# Patient Record
Sex: Female | Born: 1985 | Race: White | Hispanic: No | Marital: Married | Smoking: Former smoker
Health system: Southern US, Community
[De-identification: ages and names within clinical notes are randomized; demographics above are authoritative.]

## PROBLEM LIST (undated history)

## (undated) DIAGNOSIS — Z889 Allergy status to unspecified drugs, medicaments and biological substances status: Secondary | ICD-10-CM

## (undated) DIAGNOSIS — Q999 Chromosomal abnormality, unspecified: Secondary | ICD-10-CM

## (undated) DIAGNOSIS — M1990 Unspecified osteoarthritis, unspecified site: Secondary | ICD-10-CM

## (undated) HISTORY — PX: MOUTH SURGERY: SHX715

## (undated) HISTORY — DX: Chromosomal abnormality, unspecified: Q99.9

## (undated) HISTORY — DX: Allergy status to unspecified drugs, medicaments and biological substances: Z88.9

## (undated) HISTORY — DX: Unspecified osteoarthritis, unspecified site: M19.90

---

## 2016-07-17 ENCOUNTER — Ambulatory Visit (INDEPENDENT_AMBULATORY_CARE_PROVIDER_SITE_OTHER): Payer: BLUE CROSS/BLUE SHIELD | Admitting: Women's Health

## 2016-07-17 ENCOUNTER — Encounter: Payer: Self-pay | Admitting: Women's Health

## 2016-07-17 ENCOUNTER — Encounter: Payer: Self-pay | Admitting: *Deleted

## 2016-07-17 ENCOUNTER — Ambulatory Visit (INDEPENDENT_AMBULATORY_CARE_PROVIDER_SITE_OTHER): Payer: BLUE CROSS/BLUE SHIELD | Admitting: *Deleted

## 2016-07-17 ENCOUNTER — Other Ambulatory Visit: Payer: Self-pay | Admitting: Women's Health

## 2016-07-17 VITALS — BP 110/58 | HR 80 | Ht <= 58 in | Wt 102.0 lb

## 2016-07-17 DIAGNOSIS — N6310 Unspecified lump in the right breast, unspecified quadrant: Secondary | ICD-10-CM

## 2016-07-17 DIAGNOSIS — Q999 Chromosomal abnormality, unspecified: Secondary | ICD-10-CM | POA: Diagnosis not present

## 2016-07-17 DIAGNOSIS — N63 Unspecified lump in unspecified breast: Secondary | ICD-10-CM | POA: Insufficient documentation

## 2016-07-17 DIAGNOSIS — Z3042 Encounter for surveillance of injectable contraceptive: Secondary | ICD-10-CM | POA: Diagnosis not present

## 2016-07-17 DIAGNOSIS — Z3202 Encounter for pregnancy test, result negative: Secondary | ICD-10-CM | POA: Diagnosis not present

## 2016-07-17 DIAGNOSIS — M0890 Juvenile arthritis, unspecified, unspecified site: Secondary | ICD-10-CM | POA: Diagnosis not present

## 2016-07-17 DIAGNOSIS — Z720 Tobacco use: Secondary | ICD-10-CM | POA: Diagnosis not present

## 2016-07-17 DIAGNOSIS — Z01411 Encounter for gynecological examination (general) (routine) with abnormal findings: Secondary | ICD-10-CM | POA: Diagnosis not present

## 2016-07-17 DIAGNOSIS — N6320 Unspecified lump in the left breast, unspecified quadrant: Secondary | ICD-10-CM

## 2016-07-17 DIAGNOSIS — F17200 Nicotine dependence, unspecified, uncomplicated: Secondary | ICD-10-CM | POA: Insufficient documentation

## 2016-07-17 DIAGNOSIS — Z01419 Encounter for gynecological examination (general) (routine) without abnormal findings: Secondary | ICD-10-CM

## 2016-07-17 LAB — POCT URINE PREGNANCY: PREG TEST UR: NEGATIVE

## 2016-07-17 MED ORDER — MEDROXYPROGESTERONE ACETATE 150 MG/ML IM SUSP: 150.0000 mg | INTRAMUSCULAR | 3 refills | Status: DC

## 2016-07-17 MED ORDER — MEDROXYPROGESTERONE ACETATE 150 MG/ML IM SUSP
150.0000 mg | Freq: Once | INTRAMUSCULAR | Status: AC
  Administered 2016-07-17: 150 mg via INTRAMUSCULAR

## 2016-07-17 NOTE — Progress Notes (Signed)
Pt here for Depo. Pt tolerated shot well. Return in 12 weeks for next shot. JSY 

## 2016-07-17 NOTE — Patient Instructions (Signed)
Your breast ultrasound is scheduled for 8/1 @ 4:10pm at [facility]- be there at 3:50pm to register. No lotion/deoderant/powder/perfume that day

## 2016-07-17 NOTE — Progress Notes (Signed)
Subjective:   Angelica Allen is a 30 y.o. G31P1001 Caucasian female here for a routine well-woman exam.  No LMP recorded (lmp unknown).    Current complaints: bilateral breasts sore and recently lactating from bilateral breasts after 79yr of not breastfeeding. Mom recently had abnormal mammogram and is going back for further w/u-so she is concerned.  Recently moved here, has been getting Depo since April 2015 in [place]- last shot 4/29, so is due for one now.  PCP: none       Does desire labs, doesn't desire STI screening  Social History: Sexual: heterosexual Marital Status: married Living situation: with family Tobacco/alcohol: smokes ~1/2ppd, not interested in quitting  The following portions of the patient's history were reviewed and updated as appropriate: allergies, current medications, past family history, past medical history, past social history, past surgical history and problem list.  Past Medical History Past Medical History:  Diagnosis Date  . Arthritis   . Chromosomal abnormality   . Multiple allergies     Past Surgical History Past Surgical History:  Procedure Laterality Date  . CESAREAN SECTION      Gynecologic History G1P1001 No LMP recorded (lmp unknown). Contraception: Depo-Provera injections Last Pap: 03/2015. Results were: normal in [place] Last mammogram: never. Results were: n/a Last TCS: never  Obstetric History OB History  Gravida Para Term Preterm AB Living  1            SAB TAB Ectopic Multiple Live Births          1    # Outcome Date GA Lbr Len/2nd Weight Sex Delivery Anes PTL Lv  1 Gravida             Reports postdates and determined to be breech w/ fetal head 'growing' into her ribs- baby has indentation in forehead/top of head- c/s was 3hrs long, and they dislocated her Rt shoulder from the pressure of pulling to get the baby out  Current Medications No current outpatient prescriptions on file prior to visit.   No current  facility-administered medications on file prior to visit.     Review of Systems Patient denies any headaches, blurred vision, shortness of breath, chest pain, abdominal pain, problems with bowel movements, urination, or intercourse.  Objective:  BP (!) 110/58 (BP Location: Right Arm, Patient Position: Sitting)   Pulse 80   Ht 4' 6.5" (1.384 m)   Wt 102 lb (46.3 kg)   LMP  (LMP Unknown) Comment: on Depo Provera  BMI 24.14 kg/m  Physical Exam  General:  Well developed, well nourished, no acute distress. She is alert and oriented x3. Skin:  Warm and dry Neck:  Midline trachea, no thyromegaly or nodules Chest: sternal prominence superior to concave area of chest Cardiovascular: Regular rate and rhythm, no murmur heard Lungs:  Effort normal, all lung fields clear to auscultation bilaterally Breasts:  Lt: ~1cm long thin hard mass ~3-28fb from nipple @ 2 o'clock, otherwise breast is uniformly lumpy, Rt: large ~3cm round mobile flat area ~50fb from nipple at 10 o'clock, otherwise uniformly lumpy. No discharge, skin changes, or axillary lymphadenopathy. Abdomen:  Soft, non tender, no hepatosplenomegaly or masses Pelvic:  External genitalia is normal in appearance.  The vagina is normal in appearance. The cervix is bulbous, nulliparous, no CMT.  Thin prep pap is not done. Uterus is felt to be normal size, shape, and contour.  No adnexal masses or tenderness noted. 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 Smoker Juvenile arthritis d/t chromosomal abnormality Lactating after 40yr of not breastfeeding Bilateral breast masses Contraception  management  Plan:  CBC, CMP, TSH, A1C today Scheduled bilateral breast u/s, 8/1 @ [facility], be there at 3:50 for 4:10 appt, no lotion/deoderant/powder/perfume Discussed sporadic lactation can be normal for up to 3-6yrs after breastfeeding Rx depo w/ 3RF F/U today for depo injection, then 37yr for physical, or sooner if needed Sign release to get c/s op note from [facility] Advised smoking cessation- not interested at this time Mammogram @29yo  or sooner if problems Colonoscopy @30yo  or sooner if problems  Marge Duncans CNM, [facility] 07/17/2016 11:33 AM

## 2016-07-18 LAB — CBC
HEMATOCRIT: 38.4 % (ref 34.0–46.6)
HEMOGLOBIN: 13.1 g/dL (ref 11.1–15.9)
MCH: 31.2 pg (ref 26.6–33.0)
MCHC: 34.1 g/dL (ref 31.5–35.7)
MCV: 91 fL (ref 79–97)
Platelets: 234 10*3/uL (ref 150–379)
RBC: 4.2 x10E6/uL (ref 3.77–5.28)
RDW: 13.3 % (ref 12.3–15.4)
WBC: 7.2 10*3/uL (ref 3.4–10.8)

## 2016-07-18 LAB — COMPREHENSIVE METABOLIC PANEL
ALBUMIN: 4.4 g/dL (ref 3.5–5.5)
ALK PHOS: 78 IU/L (ref 39–117)
ALT: 50 IU/L — ABNORMAL HIGH (ref 0–32)
AST: 38 IU/L (ref 0–40)
Albumin/Globulin Ratio: 1.6 (ref 1.2–2.2)
BUN / CREAT RATIO: 13 (ref 9–23)
BUN: 10 mg/dL (ref 6–20)
Bilirubin Total: 1.1 mg/dL (ref 0.0–1.2)
CALCIUM: 9.4 mg/dL (ref 8.7–10.2)
CO2: 24 mmol/L (ref 18–29)
CREATININE: 0.78 mg/dL (ref 0.57–1.00)
Chloride: 102 mmol/L (ref 96–106)
GFR calc Af Amer: 119 mL/min/{1.73_m2} (ref >59)
GFR, EST NON AFRICAN AMERICAN: 103 mL/min/{1.73_m2} (ref >59)
GLOBULIN, TOTAL: 2.8 g/dL (ref 1.5–4.5)
GLUCOSE: 88 mg/dL (ref 65–99)
Potassium: 4.4 mmol/L (ref 3.5–5.2)
SODIUM: 141 mmol/L (ref 134–144)
Total Protein: 7.2 g/dL (ref 6.0–8.5)

## 2016-07-18 LAB — HEMOGLOBIN A1C
Est. average glucose Bld gHb Est-mCnc: 88 mg/dL
HEMOGLOBIN A1C: 4.7 % — AB (ref 4.8–5.6)

## 2016-07-18 LAB — TSH: TSH: 0.936 u[IU]/mL (ref 0.450–4.500)

## 2016-07-24 ENCOUNTER — Other Ambulatory Visit (HOSPITAL_COMMUNITY): Payer: Self-pay

## 2016-07-24 ENCOUNTER — Ambulatory Visit (HOSPITAL_COMMUNITY)
Admission: RE | Admit: 2016-07-24 | Discharge: 2016-07-24 | Disposition: A | Discharge status: Home / Self Care | Payer: BLUE CROSS/BLUE SHIELD | Source: Ambulatory Visit | Attending: Women's Health | Admitting: Women's Health

## 2016-07-24 ENCOUNTER — Inpatient Hospital Stay (HOSPITAL_COMMUNITY): Admission: RE | Admit: 2016-07-24 | Payer: Self-pay | Source: Ambulatory Visit

## 2016-07-24 DIAGNOSIS — N6310 Unspecified lump in the right breast, unspecified quadrant: Secondary | ICD-10-CM

## 2016-07-24 DIAGNOSIS — N63 Unspecified lump in breast: Secondary | ICD-10-CM | POA: Insufficient documentation

## 2016-07-25 ENCOUNTER — Telehealth: Payer: Self-pay | Admitting: Women's Health

## 2016-07-25 NOTE — Telephone Encounter (Signed)
LM to return call, need to review labs.  Cheral Marker, CNM, [facility] 07/25/2016 10:19 AM

## 2016-08-01 ENCOUNTER — Telehealth: Payer: Self-pay | Admitting: Adult Health

## 2016-08-01 NOTE — Telephone Encounter (Signed)
Pt called stating that she received a message from [place] regarding her results 2 weeks ago. Pt would like a call back from a nurse. Please contact pt

## 2016-08-02 ENCOUNTER — Telehealth: Payer: Self-pay | Admitting: Women's Health

## 2016-08-02 NOTE — Telephone Encounter (Signed)
Pt informed labs (07/17/2016) and mammogram from 07/24/2016 WNL per Cyril MourningJennifer Griffin, NP. Pt verbalized understanding.

## 2016-08-15 ENCOUNTER — Other Ambulatory Visit: Payer: Self-pay | Admitting: Women's Health

## 2016-08-15 ENCOUNTER — Telehealth: Payer: Self-pay | Admitting: *Deleted

## 2016-08-15 DIAGNOSIS — R945 Abnormal results of liver function studies: Principal | ICD-10-CM

## 2016-08-15 DIAGNOSIS — R7989 Other specified abnormal findings of blood chemistry: Secondary | ICD-10-CM

## 2016-08-15 NOTE — Telephone Encounter (Signed)
Pt informed per Joellyn HaffKim Booker, CNM liver function test slightly elevated, repeat CMP in 2 weeks. Pt informed to go to [facility] order already placed

## 2016-08-31 LAB — COMPREHENSIVE METABOLIC PANEL
A/G RATIO: 1.5 (ref 1.2–2.2)
ALBUMIN: 4.3 g/dL (ref 3.5–5.5)
ALT: 38 IU/L — AB (ref 0–32)
AST: 29 IU/L (ref 0–40)
Alkaline Phosphatase: 75 IU/L (ref 39–117)
BILIRUBIN TOTAL: 1 mg/dL (ref 0.0–1.2)
BUN/Creatinine Ratio: 15 (ref 9–23)
BUN: 12 mg/dL (ref 6–20)
CALCIUM: 9.3 mg/dL (ref 8.7–10.2)
CHLORIDE: 103 mmol/L (ref 96–106)
CO2: 23 mmol/L (ref 18–29)
Creatinine, Ser: 0.82 mg/dL (ref 0.57–1.00)
GFR calc non Af Amer: 97 mL/min/{1.73_m2} (ref >59)
GFR, EST AFRICAN AMERICAN: 112 mL/min/{1.73_m2} (ref >59)
Globulin, Total: 2.9 g/dL (ref 1.5–4.5)
Glucose: 85 mg/dL (ref 65–99)
POTASSIUM: 4.4 mmol/L (ref 3.5–5.2)
Sodium: 140 mmol/L (ref 134–144)
TOTAL PROTEIN: 7.2 g/dL (ref 6.0–8.5)

## 2016-09-25 ENCOUNTER — Telehealth: Payer: Self-pay | Admitting: Women's Health

## 2016-09-25 NOTE — Telephone Encounter (Signed)
Called to discuss labs. VM box not set up.  Cheral MarkerKimberly R. Infantof Villagomez, CNM, [facility] 09/25/2016 12:58 PM

## 2016-10-09 ENCOUNTER — Ambulatory Visit: Payer: BLUE CROSS/BLUE SHIELD

## 2016-10-10 ENCOUNTER — Ambulatory Visit (INDEPENDENT_AMBULATORY_CARE_PROVIDER_SITE_OTHER): Payer: Managed Care, Other (non HMO) | Admitting: *Deleted

## 2016-10-10 ENCOUNTER — Encounter: Payer: Self-pay | Admitting: *Deleted

## 2016-10-10 DIAGNOSIS — Z3042 Encounter for surveillance of injectable contraceptive: Secondary | ICD-10-CM

## 2016-10-10 DIAGNOSIS — Z308 Encounter for other contraceptive management: Secondary | ICD-10-CM

## 2016-10-10 DIAGNOSIS — Z3202 Encounter for pregnancy test, result negative: Secondary | ICD-10-CM

## 2016-10-10 LAB — POCT URINE PREGNANCY: PREG TEST UR: NEGATIVE

## 2016-10-10 MED ORDER — MEDROXYPROGESTERONE ACETATE 150 MG/ML IM SUSP
150.0000 mg | Freq: Once | INTRAMUSCULAR | Status: AC
  Administered 2016-10-10: 150 mg via INTRAMUSCULAR

## 2016-10-10 NOTE — Progress Notes (Signed)
Pt here for Depo. Pt tolerated shot well. Return in 12 weeks for next shot. JSY 

## 2017-01-09 ENCOUNTER — Ambulatory Visit: Payer: Managed Care, Other (non HMO)

## 2017-01-10 ENCOUNTER — Ambulatory Visit: Payer: Managed Care, Other (non HMO)

## 2017-01-11 ENCOUNTER — Encounter: Payer: Self-pay | Admitting: *Deleted

## 2017-01-11 ENCOUNTER — Ambulatory Visit (INDEPENDENT_AMBULATORY_CARE_PROVIDER_SITE_OTHER): Payer: Managed Care, Other (non HMO) | Admitting: *Deleted

## 2017-01-11 DIAGNOSIS — Z3042 Encounter for surveillance of injectable contraceptive: Secondary | ICD-10-CM

## 2017-01-11 DIAGNOSIS — Z3202 Encounter for pregnancy test, result negative: Secondary | ICD-10-CM | POA: Diagnosis not present

## 2017-01-11 DIAGNOSIS — Z308 Encounter for other contraceptive management: Secondary | ICD-10-CM

## 2017-01-11 LAB — POCT URINE PREGNANCY: Preg Test, Ur: NEGATIVE

## 2017-01-11 MED ORDER — MEDROXYPROGESTERONE ACETATE 150 MG/ML IM SUSP
150.0000 mg | Freq: Once | INTRAMUSCULAR | Status: AC
  Administered 2017-01-11: 150 mg via INTRAMUSCULAR

## 2017-01-11 NOTE — Progress Notes (Signed)
Pt here for Depo. Pt is 2 days late getting shot. Last sex was 1/17. JAG advised ok to give shot. Neg UPT. Pt tolerated shot well. Return in 12 weeks for next shot. JSY

## 2017-04-05 ENCOUNTER — Ambulatory Visit (INDEPENDENT_AMBULATORY_CARE_PROVIDER_SITE_OTHER): Payer: Self-pay | Admitting: *Deleted

## 2017-04-05 ENCOUNTER — Encounter: Payer: Self-pay | Admitting: *Deleted

## 2017-04-05 DIAGNOSIS — Z3202 Encounter for pregnancy test, result negative: Secondary | ICD-10-CM

## 2017-04-05 DIAGNOSIS — Z308 Encounter for other contraceptive management: Secondary | ICD-10-CM

## 2017-04-05 DIAGNOSIS — Z3042 Encounter for surveillance of injectable contraceptive: Secondary | ICD-10-CM

## 2017-04-05 LAB — POCT URINE PREGNANCY: PREG TEST UR: NEGATIVE

## 2017-04-05 MED ORDER — MEDROXYPROGESTERONE ACETATE 150 MG/ML IM SUSP
150.0000 mg | Freq: Once | INTRAMUSCULAR | Status: AC
  Administered 2017-04-05: 150 mg via INTRAMUSCULAR

## 2017-04-05 NOTE — Progress Notes (Signed)
Pt here for Depo. Pt tolerated shot well. Return in 12 weeks for next shot. JSY 

## 2017-07-01 ENCOUNTER — Other Ambulatory Visit: Payer: Self-pay | Admitting: Women's Health

## 2017-07-02 ENCOUNTER — Ambulatory Visit: Payer: Self-pay

## 2017-07-03 ENCOUNTER — Encounter: Payer: Self-pay | Admitting: *Deleted

## 2017-07-03 ENCOUNTER — Ambulatory Visit (INDEPENDENT_AMBULATORY_CARE_PROVIDER_SITE_OTHER): Payer: Self-pay | Admitting: *Deleted

## 2017-07-03 DIAGNOSIS — Z3202 Encounter for pregnancy test, result negative: Secondary | ICD-10-CM

## 2017-07-03 DIAGNOSIS — Z308 Encounter for other contraceptive management: Secondary | ICD-10-CM

## 2017-07-03 DIAGNOSIS — Z3042 Encounter for surveillance of injectable contraceptive: Secondary | ICD-10-CM

## 2017-07-03 LAB — POCT URINE PREGNANCY: PREG TEST UR: NEGATIVE

## 2017-07-03 MED ORDER — MEDROXYPROGESTERONE ACETATE 150 MG/ML IM SUSP
150.0000 mg | Freq: Once | INTRAMUSCULAR | Status: AC
  Administered 2017-07-03: 150 mg via INTRAMUSCULAR

## 2017-07-03 NOTE — Progress Notes (Signed)
Pt here for Depo. Pt tolerated shot well. Return in 12 weeks for next shot. JSY 

## 2017-09-24 ENCOUNTER — Other Ambulatory Visit: Payer: Self-pay | Admitting: Women's Health

## 2017-09-25 ENCOUNTER — Telehealth: Payer: Self-pay | Admitting: Women's Health

## 2017-09-25 ENCOUNTER — Ambulatory Visit: Payer: Self-pay

## 2017-09-25 NOTE — Telephone Encounter (Signed)
Patient informed that she would need an appointment for any further refills.

## 2017-09-26 ENCOUNTER — Ambulatory Visit: Payer: Self-pay

## 2017-09-30 ENCOUNTER — Encounter: Payer: Self-pay | Admitting: Women's Health

## 2017-09-30 ENCOUNTER — Ambulatory Visit (INDEPENDENT_AMBULATORY_CARE_PROVIDER_SITE_OTHER): Payer: Self-pay | Admitting: Women's Health

## 2017-09-30 ENCOUNTER — Ambulatory Visit (INDEPENDENT_AMBULATORY_CARE_PROVIDER_SITE_OTHER): Payer: Self-pay | Admitting: *Deleted

## 2017-09-30 VITALS — BP 100/60 | HR 80 | Ht <= 58 in | Wt 105.0 lb

## 2017-09-30 DIAGNOSIS — Z3202 Encounter for pregnancy test, result negative: Secondary | ICD-10-CM

## 2017-09-30 DIAGNOSIS — Z3042 Encounter for surveillance of injectable contraceptive: Secondary | ICD-10-CM

## 2017-09-30 DIAGNOSIS — Z01419 Encounter for gynecological examination (general) (routine) without abnormal findings: Secondary | ICD-10-CM

## 2017-09-30 LAB — POCT URINE PREGNANCY: Preg Test, Ur: NEGATIVE

## 2017-09-30 MED ORDER — MEDROXYPROGESTERONE ACETATE 150 MG/ML IM SUSP
150.0000 mg | Freq: Once | INTRAMUSCULAR | Status: AC
  Administered 2017-09-30: 150 mg via INTRAMUSCULAR

## 2017-09-30 MED ORDER — MEDROXYPROGESTERONE ACETATE 150 MG/ML IM SUSP: 150.0000 mg | INTRAMUSCULAR | 3 refills | Status: DC

## 2017-09-30 NOTE — Progress Notes (Signed)
Pt given depo provera  IM left VG without complications. Pt here earlier today with neg preg test. Advised pt to return in 12 weeks for next injection.

## 2017-09-30 NOTE — Progress Notes (Signed)
   [facility] Pap [facility] Physical  Patient name: Angelica Allen MRN [id number]  Date of birth: Oct 05, 1986 CC & HPI:  Angelica Allen is a 31 y.o. G10P1001 Caucasian female being seen today for a routine well-woman exam.  Current complaints: irritated area around rectum, has tried gold bond and hemorrhoid cream w/o relief. Needs refill on depo  PCP: none      does not desire labs No LMP recorded. The current method of family planning is Depo-Provera injections Last pap 03/2015 in [place]. Results were: normal Last mammogram: never. Results were: had breast u/s 07/24/16 d/t bilateral breast masses and lactating after 57yr of not breastfeeding- u/s was normal Last colonoscopy: never. Results were: n/a  Review of Systems:   Denies any headaches, blurred vision, fatigue, shortness of breath, chest pain, abdominal pain, abnormal vaginal discharge/itching/odor/irritation, problems with periods, bowel movements, urination, or intercourse unless otherwise stated above.  Pertinent History Reviewed:  Reviewed past medical,surgical, social and family history.  Reviewed problem list, medications and allergies. Objective Findings:   Vitals:   09/30/17 1205  BP: 100/60  Pulse: 80  Weight: 105 lb (47.6 kg)  Height:  (1.372 m)    Body mass index is 25.32 kg/m.  Physical Examination: General appearance - well appearing, and in no distress Mental status - alert, oriented to person, place, and time Psych:  She has a normal mood and affect Skin - warm and dry, normal color, no suspicious lesions noted Chest - effort normal, all lung fields clear to auscultation bilaterally Heart - normal rate and regular rhythm Neck:  midline trachea, no thyromegaly or nodules Breasts - breasts appear normal, no suspicious masses, no skin or nipple changes or  axillary nodes Abdomen - soft, nontender, nondistended, no masses or organomegaly Pelvic - VULVA: normal appearing vulva with no masses, tenderness or lesions   VAGINA: normal appearing vagina with normal color and discharge, no lesions  CERVIX: normal appearing cervix without discharge or lesions, no CMT  Thin prep pap is not done HR HPV cotesting  UTERUS: uterus is felt to be normal size, shape, consistency and nontender   ADNEXA: No adnexal masses or tenderness noted. Rectal: 2 small erythematous areas around anus, look like fissures in skin/irritation from wiping, no hemorrhoids noted Extremities:  No swelling or varicosities noted  Results for orders placed or performed in visit on 09/30/17 (from the past 24 hour(s))  POCT urine pregnancy   Collection Time: 09/30/17 12:14 PM  Result Value Ref Range   Preg Test, Ur Negative Negative    Assessment & Plan:  1) Healthy Well-Woman Exam 2) Contraception management> refilled depo x 20yr 3) Anal irritation> try A&D ointment of diaper rash cream, let us know if not improving  Mammogram  or sooner if problems Colonoscopy  or sooner if problems  Orders Placed This Encounter  Procedures  . POCT urine pregnancy    Return for today for depo, then 90yr for , Pap & physical.  Marge Duncans CNM, [facility] 09/30/2017 12:54 PM

## 2017-12-26 ENCOUNTER — Ambulatory Visit: Payer: Self-pay

## 2017-12-27 ENCOUNTER — Encounter: Payer: Self-pay | Admitting: *Deleted

## 2017-12-27 ENCOUNTER — Ambulatory Visit (INDEPENDENT_AMBULATORY_CARE_PROVIDER_SITE_OTHER): Payer: Self-pay | Admitting: *Deleted

## 2017-12-27 DIAGNOSIS — Z308 Encounter for other contraceptive management: Secondary | ICD-10-CM

## 2017-12-27 DIAGNOSIS — Z3202 Encounter for pregnancy test, result negative: Secondary | ICD-10-CM

## 2017-12-27 DIAGNOSIS — Z3042 Encounter for surveillance of injectable contraceptive: Secondary | ICD-10-CM

## 2017-12-27 LAB — POCT URINE PREGNANCY: Preg Test, Ur: NEGATIVE

## 2017-12-27 MED ORDER — MEDROXYPROGESTERONE ACETATE 150 MG/ML IM SUSP
150.0000 mg | Freq: Once | INTRAMUSCULAR | Status: AC
  Administered 2017-12-27: 150 mg via INTRAMUSCULAR

## 2017-12-27 NOTE — Progress Notes (Signed)
Pt here for Depo. Pt tolerated shot well. Return in 12 weeks for next shot. JSY 

## 2018-03-20 ENCOUNTER — Ambulatory Visit (INDEPENDENT_AMBULATORY_CARE_PROVIDER_SITE_OTHER): Payer: Self-pay | Admitting: *Deleted

## 2018-03-20 ENCOUNTER — Encounter: Payer: Self-pay | Admitting: *Deleted

## 2018-03-20 ENCOUNTER — Other Ambulatory Visit: Payer: Self-pay

## 2018-03-20 DIAGNOSIS — Z3202 Encounter for pregnancy test, result negative: Secondary | ICD-10-CM

## 2018-03-20 DIAGNOSIS — Z3042 Encounter for surveillance of injectable contraceptive: Secondary | ICD-10-CM

## 2018-03-20 LAB — POCT URINE PREGNANCY: Preg Test, Ur: NEGATIVE

## 2018-03-20 MED ORDER — MEDROXYPROGESTERONE ACETATE 150 MG/ML IM SUSP
150.0000 mg | Freq: Once | INTRAMUSCULAR | Status: AC
  Administered 2018-03-20: 150 mg via INTRAMUSCULAR

## 2018-03-20 NOTE — Progress Notes (Signed)
Pt given depoprovera 150mg  IM right VG without complications. Advised to return in 12 weeks for next injection.

## 2018-06-12 ENCOUNTER — Ambulatory Visit (INDEPENDENT_AMBULATORY_CARE_PROVIDER_SITE_OTHER): Payer: Self-pay

## 2018-06-12 VITALS — Ht <= 58 in | Wt 102.0 lb

## 2018-06-12 DIAGNOSIS — Z3042 Encounter for surveillance of injectable contraceptive: Secondary | ICD-10-CM

## 2018-06-12 DIAGNOSIS — Z3202 Encounter for pregnancy test, result negative: Secondary | ICD-10-CM

## 2018-06-12 LAB — POCT URINE PREGNANCY: PREG TEST UR: NEGATIVE

## 2018-06-12 MED ORDER — MEDROXYPROGESTERONE ACETATE 150 MG/ML IM SUSP
150.0000 mg | Freq: Once | INTRAMUSCULAR | Status: AC
  Administered 2018-06-12: 150 mg via INTRAMUSCULAR

## 2018-06-12 NOTE — Progress Notes (Signed)
PT here for depo injection 150 mg given lt VG. Tolerated well. Return 12 week for next injection.Pad CMA

## 2018-07-09 ENCOUNTER — Other Ambulatory Visit: Payer: Self-pay | Admitting: Women's Health

## 2018-07-21 ENCOUNTER — Other Ambulatory Visit: Payer: Self-pay | Admitting: Women's Health

## 2018-08-07 ENCOUNTER — Encounter: Payer: Self-pay | Admitting: Women's Health

## 2018-08-29 ENCOUNTER — Other Ambulatory Visit: Payer: Self-pay | Admitting: Women's Health

## 2018-09-04 ENCOUNTER — Ambulatory Visit: Payer: Self-pay

## 2018-09-04 ENCOUNTER — Ambulatory Visit (INDEPENDENT_AMBULATORY_CARE_PROVIDER_SITE_OTHER): Payer: Self-pay

## 2018-09-04 VITALS — Ht <= 58 in | Wt 101.0 lb

## 2018-09-04 DIAGNOSIS — Z3042 Encounter for surveillance of injectable contraceptive: Secondary | ICD-10-CM

## 2018-09-04 DIAGNOSIS — Z3202 Encounter for pregnancy test, result negative: Secondary | ICD-10-CM

## 2018-09-04 LAB — POCT URINE PREGNANCY: Preg Test, Ur: NEGATIVE

## 2018-09-04 MED ORDER — MEDROXYPROGESTERONE ACETATE 150 MG/ML IM SUSP
150.0000 mg | Freq: Once | INTRAMUSCULAR | Status: AC
Start: 2018-09-04 — End: 2018-09-04
  Administered 2018-09-04: 150 mg via INTRAMUSCULAR

## 2018-09-04 NOTE — Progress Notes (Signed)
Pt here for depo injection 150 mg IM given rt VG. Tolerated well. Return 12 weeks for next injection. Pad CMA 

## 2018-09-05 ENCOUNTER — Other Ambulatory Visit: Payer: Self-pay | Admitting: Women's Health

## 2018-11-24 ENCOUNTER — Ambulatory Visit (INDEPENDENT_AMBULATORY_CARE_PROVIDER_SITE_OTHER): Payer: Self-pay

## 2018-11-24 VITALS — Ht <= 58 in | Wt 100.0 lb

## 2018-11-24 DIAGNOSIS — Z3202 Encounter for pregnancy test, result negative: Secondary | ICD-10-CM

## 2018-11-24 DIAGNOSIS — Z3042 Encounter for surveillance of injectable contraceptive: Secondary | ICD-10-CM

## 2018-11-24 LAB — POCT URINE PREGNANCY: PREG TEST UR: NEGATIVE

## 2018-11-24 MED ORDER — MEDROXYPROGESTERONE ACETATE 150 MG/ML IM SUSP
150.0000 mg | Freq: Once | INTRAMUSCULAR | Status: AC
  Administered 2018-11-24: 150 mg via INTRAMUSCULAR

## 2018-11-24 NOTE — Progress Notes (Signed)
Pt here for depo injection 150 mg IM given lt VG. Tolerated well. Return 12 weeks for next injection. Pad CMA 

## 2019-02-16 ENCOUNTER — Ambulatory Visit (INDEPENDENT_AMBULATORY_CARE_PROVIDER_SITE_OTHER): Payer: Self-pay | Admitting: *Deleted

## 2019-02-16 ENCOUNTER — Encounter: Payer: Self-pay | Admitting: *Deleted

## 2019-02-16 DIAGNOSIS — Z3042 Encounter for surveillance of injectable contraceptive: Secondary | ICD-10-CM

## 2019-02-16 DIAGNOSIS — Z308 Encounter for other contraceptive management: Secondary | ICD-10-CM

## 2019-02-16 DIAGNOSIS — Z3202 Encounter for pregnancy test, result negative: Secondary | ICD-10-CM

## 2019-02-16 LAB — POCT URINE PREGNANCY: Preg Test, Ur: NEGATIVE

## 2019-02-16 MED ORDER — MEDROXYPROGESTERONE ACETATE 150 MG/ML IM SUSP
150.0000 mg | Freq: Once | INTRAMUSCULAR | Status: AC
  Administered 2019-02-16: 150 mg via INTRAMUSCULAR

## 2019-02-16 NOTE — Progress Notes (Signed)
Pt here for Depo. Pt received shot in left hip. Pt tolerated shot well. Return in 12 weeks for next shot. Also, schedule physical.  JSY

## 2019-05-05 ENCOUNTER — Telehealth: Payer: Self-pay | Admitting: Women's Health

## 2019-05-05 NOTE — Telephone Encounter (Signed)
Pts pap/physical had to be rescheduled du to COVID-19. Pt requesting depo rx to be sent in to her pharmacy so that she may still get her depo shot on Monday.

## 2019-05-06 MED ORDER — MEDROXYPROGESTERONE ACETATE 150 MG/ML IM SUSP: 150.0000 mg | INTRAMUSCULAR | 3 refills | Status: DC

## 2019-05-06 NOTE — Addendum Note (Signed)
Addended by: Shawna Clamp R on: 05/06/2019 02:05 PM   Modules accepted: Orders

## 2019-05-11 ENCOUNTER — Ambulatory Visit (INDEPENDENT_AMBULATORY_CARE_PROVIDER_SITE_OTHER): Payer: Self-pay | Admitting: *Deleted

## 2019-05-11 ENCOUNTER — Other Ambulatory Visit: Payer: Self-pay

## 2019-05-11 ENCOUNTER — Ambulatory Visit: Payer: Self-pay

## 2019-05-11 ENCOUNTER — Other Ambulatory Visit: Payer: Self-pay | Admitting: Women's Health

## 2019-05-11 DIAGNOSIS — Z3042 Encounter for surveillance of injectable contraceptive: Secondary | ICD-10-CM

## 2019-05-11 DIAGNOSIS — Z308 Encounter for other contraceptive management: Secondary | ICD-10-CM

## 2019-05-11 MED ORDER — MEDROXYPROGESTERONE ACETATE 150 MG/ML IM SUSP
150.0000 mg | Freq: Once | INTRAMUSCULAR | Status: AC
  Administered 2019-05-11: 15:00:00 150 mg via INTRAMUSCULAR

## 2019-05-11 NOTE — Progress Notes (Signed)
Depo Provera 150 mg given IM in right gluteal.

## 2019-06-08 ENCOUNTER — Other Ambulatory Visit: Payer: BLUE CROSS/BLUE SHIELD | Admitting: Women's Health

## 2019-06-17 ENCOUNTER — Other Ambulatory Visit: Payer: Self-pay | Admitting: Women's Health

## 2019-07-01 ENCOUNTER — Other Ambulatory Visit: Payer: Self-pay | Admitting: Adult Health

## 2019-08-03 ENCOUNTER — Ambulatory Visit (INDEPENDENT_AMBULATORY_CARE_PROVIDER_SITE_OTHER): Payer: Self-pay | Admitting: *Deleted

## 2019-08-03 ENCOUNTER — Other Ambulatory Visit: Payer: Self-pay

## 2019-08-03 DIAGNOSIS — Z3042 Encounter for surveillance of injectable contraceptive: Secondary | ICD-10-CM

## 2019-08-03 MED ORDER — MEDROXYPROGESTERONE ACETATE 150 MG/ML IM SUSP
150.0000 mg | Freq: Once | INTRAMUSCULAR | Status: AC
  Administered 2019-08-03: 150 mg via INTRAMUSCULAR

## 2019-08-03 NOTE — Progress Notes (Signed)
Depo Provera 150 mg given IM in left upper outer gluteal. Patient tolerated well. Next dose in 12 weeks.

## 2019-08-06 ENCOUNTER — Other Ambulatory Visit: Payer: Self-pay | Admitting: Adult Health

## 2019-10-26 ENCOUNTER — Ambulatory Visit: Payer: Self-pay

## 2019-10-27 ENCOUNTER — Other Ambulatory Visit: Payer: Self-pay

## 2019-10-27 ENCOUNTER — Ambulatory Visit (INDEPENDENT_AMBULATORY_CARE_PROVIDER_SITE_OTHER): Payer: Self-pay | Admitting: *Deleted

## 2019-10-27 DIAGNOSIS — Z3042 Encounter for surveillance of injectable contraceptive: Secondary | ICD-10-CM

## 2019-10-27 MED ORDER — MEDROXYPROGESTERONE ACETATE 150 MG/ML IM SUSP
150.0000 mg | Freq: Once | INTRAMUSCULAR | Status: AC
  Administered 2019-10-27: 15:00:00 150 mg via INTRAMUSCULAR

## 2019-10-27 NOTE — Progress Notes (Signed)
   NURSE VISIT- INJECTION  SUBJECTIVE:  Angelica Allen is a 33 y.o. G90P1001 female here for a Depo Provera for contraception/period management. She is a GYN patient.   OBJECTIVE:  There were no vitals taken for this visit.  Appears well, in no apparent distress  Injection administered in: Right upper quad. gluteus  No orders of the defined types were placed in this encounter.   ASSESSMENT: GYN patient Depo Provera for contraception/period management  PLAN: Follow-up: in 11-13 weeks for next Depo   Rolena Infante  10/27/2019 3:19 PM

## 2019-10-29 ENCOUNTER — Ambulatory Visit (INDEPENDENT_AMBULATORY_CARE_PROVIDER_SITE_OTHER): Payer: Self-pay | Admitting: Adult Health

## 2019-10-29 ENCOUNTER — Encounter: Payer: Self-pay | Admitting: Adult Health

## 2019-10-29 ENCOUNTER — Other Ambulatory Visit (HOSPITAL_COMMUNITY)
Admission: RE | Admit: 2019-10-29 | Discharge: 2019-10-29 | Disposition: A | Discharge status: Home / Self Care | Payer: Self-pay | Source: Ambulatory Visit | Attending: Adult Health | Admitting: Adult Health

## 2019-10-29 ENCOUNTER — Other Ambulatory Visit: Payer: Self-pay

## 2019-10-29 VITALS — BP 101/67 | HR 80 | Ht <= 58 in | Wt 98.0 lb

## 2019-10-29 DIAGNOSIS — Z3042 Encounter for surveillance of injectable contraceptive: Secondary | ICD-10-CM

## 2019-10-29 DIAGNOSIS — Z01419 Encounter for gynecological examination (general) (routine) without abnormal findings: Secondary | ICD-10-CM

## 2019-10-29 MED ORDER — MEDROXYPROGESTERONE ACETATE 150 MG/ML IM SUSP: 150.0000 mg | INTRAMUSCULAR | 4 refills | Status: DC

## 2019-10-29 NOTE — Progress Notes (Signed)
Patient ID: Angelica Allen, female   DOB: April 09, 1986, 33 y.o.   MRN: [id number] History of Present Illness: Angelica Allen is a 33 year old white female, married, G1P1, in for a well woman gyn exam and pap.She is on depo and has no period.    Current Medications, Allergies, Past Medical History, Past Surgical History, Family History and Social History were reviewed in [employer] record.     Review of Systems: Patient denies any headaches, hearing loss, fatigue, blurred vision, shortness of breath, chest pain, abdominal pain, problems with bowel movements, urination, or intercourse(has positional discomfort at times and is dry but is working on that0. No joint pain or mood swings.    Physical Exam:BP 101/67 (BP Location: Left Arm, Patient Position: Sitting, Cuff Size: Normal)   Pulse 80   Ht 4\' 6"  (1.372 m)   Wt 98 lb (44.5 kg)   BMI 23.63 kg/m  General:  Well developed, well nourished, no acute distress Skin:  Warm and dry Neck:  Midline trachea, normal thyroid, good ROM, no lymphadenopathy Lungs; Clear to auscultation bilaterally Breast:  No dominant palpable mass, retraction, or nipple discharge Cardiovascular: Regular rate and rhythm Abdomen:  Soft, non tender, no hepatosplenomegaly Pelvic:  External genitalia is normal in appearance, no lesions.  The vagina is normal in appearance. Urethra has no lesions or masses. The cervix is nulliparous, and smooth, pap with high risk HPV 16/18 genotyping performed.Marland Kitchen  Uterus is felt to be normal size, shape, and contour.  No adnexal masses or tenderness noted.Bladder is non tender, no masses felt. Extremities/musculoskeletal:  No swelling or varicosities noted, no clubbing or cyanosis Psych:  No mood changes, alert and cooperative,seems happy Fall risk is low PHQ 2 score is 0.  Co exam with Weyman Croon FNP student  Impression and Plan: 1. Encounter for gynecological examination with Papanicolaou smear of cervix Pap  sent Physical in 1 year   2. Surveillance for Depo-Provera contraception Refilled depo Meds ordered this encounter  Medications  . medroxyPROGESTERone (DEPO-PROVERA) 150 MG/ML injection    Sig: Inject 1 mL (150 mg total) into the muscle every 3 (three) months.    Dispense:  1 mL    Refill:  4    Order Specific Question:   Supervising Provider    Answer:   Tania Ade H [[id number]]

## 2019-11-02 LAB — CYTOLOGY - PAP
Comment: NEGATIVE
Diagnosis: NEGATIVE
High risk HPV: NEGATIVE

## 2020-01-19 ENCOUNTER — Ambulatory Visit: Payer: Self-pay

## 2020-01-20 ENCOUNTER — Ambulatory Visit: Payer: Self-pay | Admitting: *Deleted

## 2020-01-20 ENCOUNTER — Other Ambulatory Visit: Payer: Self-pay

## 2020-01-20 DIAGNOSIS — Z3042 Encounter for surveillance of injectable contraceptive: Secondary | ICD-10-CM

## 2020-01-20 MED ORDER — MEDROXYPROGESTERONE ACETATE 150 MG/ML IM SUSP
150.0000 mg | Freq: Once | INTRAMUSCULAR | Status: AC
  Administered 2020-01-20: 15:00:00 150 mg via INTRAMUSCULAR

## 2020-01-20 NOTE — Progress Notes (Signed)
   NURSE VISIT- INJECTION  SUBJECTIVE:  Angelica Allen is a 34 y.o. G51P1001 female here for a Depo Provera for contraception/period management. She is a GYN patient.   OBJECTIVE:  There were no vitals taken for this visit.  Appears well, in no apparent distress  Injection administered in: Left upper quad. gluteus  Meds ordered this encounter  Medications  . medroxyPROGESTERone (DEPO-PROVERA) injection 150 mg    ASSESSMENT: GYN patient Depo Provera for contraception/period management PLAN: Follow-up: in 11-13 weeks for next Depo   Stoney Bang  01/20/2020 3:11 PM

## 2020-04-06 ENCOUNTER — Ambulatory Visit (INDEPENDENT_AMBULATORY_CARE_PROVIDER_SITE_OTHER): Payer: Self-pay | Admitting: *Deleted

## 2020-04-06 ENCOUNTER — Other Ambulatory Visit: Payer: Self-pay

## 2020-04-06 ENCOUNTER — Encounter: Payer: Self-pay | Admitting: *Deleted

## 2020-04-06 DIAGNOSIS — Z3042 Encounter for surveillance of injectable contraceptive: Secondary | ICD-10-CM

## 2020-04-06 MED ORDER — MEDROXYPROGESTERONE ACETATE 150 MG/ML IM SUSP
150.0000 mg | Freq: Once | INTRAMUSCULAR | Status: AC
  Administered 2020-04-06: 150 mg via INTRAMUSCULAR

## 2020-04-06 NOTE — Progress Notes (Signed)
   NURSE VISIT- INJECTION  SUBJECTIVE:  Angelica Allen is a 34 y.o. G64P1001 female here for a Depo Provera for contraception/period management. She is a GYN patient.   OBJECTIVE:  There were no vitals taken for this visit.  Appears well, in no apparent distress  Injection administered in: Right upper quad. gluteus  No orders of the defined types were placed in this encounter.   ASSESSMENT: GYN patient Depo Provera for contraception/period management PLAN: Follow-up: in 11-13 weeks for next Depo   Jobe Marker  04/06/2020 3:00 PM

## 2020-06-24 ENCOUNTER — Ambulatory Visit: Payer: Self-pay

## 2020-06-29 ENCOUNTER — Other Ambulatory Visit: Payer: Self-pay

## 2020-06-29 ENCOUNTER — Ambulatory Visit (INDEPENDENT_AMBULATORY_CARE_PROVIDER_SITE_OTHER): Payer: Self-pay | Admitting: *Deleted

## 2020-06-29 ENCOUNTER — Encounter: Payer: Self-pay | Admitting: *Deleted

## 2020-06-29 DIAGNOSIS — Z3042 Encounter for surveillance of injectable contraceptive: Secondary | ICD-10-CM

## 2020-06-29 MED ORDER — MEDROXYPROGESTERONE ACETATE 150 MG/ML IM SUSP
150.0000 mg | Freq: Once | INTRAMUSCULAR | Status: AC
  Administered 2020-06-29: 150 mg via INTRAMUSCULAR

## 2020-06-29 NOTE — Progress Notes (Signed)
   NURSE VISIT- INJECTION  SUBJECTIVE:  Angelica Allen is a 34 y.o. G88P1001 female here for a Depo Provera for contraception/period management. She is a GYN patient.   OBJECTIVE:  There were no vitals taken for this visit.  Appears well, in no apparent distress  Injection administered in: Left upper quad. gluteus  Meds ordered this encounter  Medications  . medroxyPROGESTERone (DEPO-PROVERA) injection 150 mg    ASSESSMENT: GYN patient Depo Provera for contraception/period management PLAN: Follow-up: in 11-13 weeks for next Depo   Jobe Marker  06/29/2020 3:22 PM

## 2020-09-21 ENCOUNTER — Ambulatory Visit (INDEPENDENT_AMBULATORY_CARE_PROVIDER_SITE_OTHER): Payer: Self-pay | Admitting: *Deleted

## 2020-09-21 DIAGNOSIS — Z308 Encounter for other contraceptive management: Secondary | ICD-10-CM

## 2020-09-21 MED ORDER — MEDROXYPROGESTERONE ACETATE 150 MG/ML IM SUSP
150.0000 mg | Freq: Once | INTRAMUSCULAR | Status: AC
  Administered 2020-09-21: 150 mg via INTRAMUSCULAR

## 2020-09-21 NOTE — Progress Notes (Signed)
   NURSE VISIT- INJECTION  SUBJECTIVE:  Angelica Allen is a 34 y.o. G51P1001 female here for a Depo Provera for contraception/period management. She is a GYN patient.   OBJECTIVE:  There were no vitals taken for this visit.  Appears well, in no apparent distress  Injection administered in: Right upper quad. gluteus  Meds ordered this encounter  Medications  . medroxyPROGESTERone (DEPO-PROVERA) injection 150 mg    ASSESSMENT: GYN patient Depo Provera for contraception/period management PLAN: Follow-up: in 11-13 weeks for next Depo   Malachy Mood  09/21/2020 3:39 PM

## 2020-12-07 ENCOUNTER — Other Ambulatory Visit: Payer: Self-pay | Admitting: Adult Health

## 2020-12-13 ENCOUNTER — Other Ambulatory Visit: Payer: Self-pay

## 2020-12-13 ENCOUNTER — Ambulatory Visit (INDEPENDENT_AMBULATORY_CARE_PROVIDER_SITE_OTHER): Payer: Self-pay | Admitting: *Deleted

## 2020-12-13 DIAGNOSIS — Z3042 Encounter for surveillance of injectable contraceptive: Secondary | ICD-10-CM

## 2020-12-13 MED ORDER — MEDROXYPROGESTERONE ACETATE 150 MG/ML IM SUSP
150.0000 mg | Freq: Once | INTRAMUSCULAR | Status: AC
  Administered 2020-12-13: 150 mg via INTRAMUSCULAR

## 2020-12-13 NOTE — Progress Notes (Signed)
   NURSE VISIT- INJECTION  SUBJECTIVE:  Angelica Allen is a 34 y.o. G54P1001 female here for a Depo Provera for contraception/period management. She is a GYN patient.   OBJECTIVE:  There were no vitals taken for this visit.  Appears well, in no apparent distress  Injection administered in: Left upper quad. gluteus  No orders of the defined types were placed in this encounter.   ASSESSMENT: GYN patient Depo Provera for contraception/period management PLAN: Follow-up: in 11-13 weeks for next Depo   Annamarie Dawley  12/13/2020 1:33 PM

## 2021-03-07 ENCOUNTER — Other Ambulatory Visit: Payer: Self-pay

## 2021-03-07 ENCOUNTER — Ambulatory Visit: Payer: Self-pay | Admitting: *Deleted

## 2021-03-07 DIAGNOSIS — Z3042 Encounter for surveillance of injectable contraceptive: Secondary | ICD-10-CM

## 2021-03-07 MED ORDER — MEDROXYPROGESTERONE ACETATE 150 MG/ML IM SUSP
150.0000 mg | Freq: Once | INTRAMUSCULAR | Status: AC
  Administered 2021-03-07: 150 mg via INTRAMUSCULAR

## 2021-03-07 NOTE — Progress Notes (Signed)
   NURSE VISIT- INJECTION  SUBJECTIVE:  Angelica Allen is a 35 y.o. G8P1001 female here for a Depo Provera for contraception/period management. She is a GYN patient.   OBJECTIVE:  There were no vitals taken for this visit.  Appears well, in no apparent distress  Injection administered in: Right upper quad. gluteus  No orders of the defined types were placed in this encounter.   ASSESSMENT: GYN patient Depo Provera for contraception/period management PLAN: Follow-up: in 11-13 weeks for next Depo   Annamarie Dawley  03/07/2021 2:53 PM

## 2021-05-25 ENCOUNTER — Ambulatory Visit (INDEPENDENT_AMBULATORY_CARE_PROVIDER_SITE_OTHER): Payer: Commercial Managed Care - PPO

## 2021-05-25 ENCOUNTER — Other Ambulatory Visit: Payer: Self-pay

## 2021-05-25 DIAGNOSIS — Z3042 Encounter for surveillance of injectable contraceptive: Secondary | ICD-10-CM

## 2021-05-25 MED ORDER — MEDROXYPROGESTERONE ACETATE 150 MG/ML IM SUSP
150.0000 mg | Freq: Once | INTRAMUSCULAR | Status: AC
  Administered 2021-05-25: 150 mg via INTRAMUSCULAR

## 2021-05-25 NOTE — Progress Notes (Signed)
   NURSE VISIT- INJECTION  SUBJECTIVE:  Angelica Allen is a 35 y.o. G36P1001 female here for a Depo Provera for contraception/period management. She is a GYN patient.   OBJECTIVE:  There were no vitals taken for this visit.  Appears well, in no apparent distress  Injection administered in: Right upper quad. gluteus  Meds ordered this encounter  Medications  . medroxyPROGESTERone (DEPO-PROVERA) injection 150 mg    ASSESSMENT: GYN patient Depo Provera for contraception/period management PLAN: Follow-up: in 11-13 weeks for next Depo   Tynia Wiers A Chrystie Hagwood  05/25/2021 2:44 PM

## 2021-08-16 ENCOUNTER — Other Ambulatory Visit: Payer: Self-pay

## 2021-08-16 ENCOUNTER — Ambulatory Visit (INDEPENDENT_AMBULATORY_CARE_PROVIDER_SITE_OTHER): Payer: Commercial Managed Care - PPO | Admitting: *Deleted

## 2021-08-16 DIAGNOSIS — Z308 Encounter for other contraceptive management: Secondary | ICD-10-CM

## 2021-08-16 DIAGNOSIS — Z3042 Encounter for surveillance of injectable contraceptive: Secondary | ICD-10-CM | POA: Diagnosis not present

## 2021-08-16 MED ORDER — MEDROXYPROGESTERONE ACETATE 150 MG/ML IM SUSP
150.0000 mg | Freq: Once | INTRAMUSCULAR | Status: AC
  Administered 2021-08-16: 150 mg via INTRAMUSCULAR

## 2021-08-16 NOTE — Progress Notes (Signed)
   NURSE VISIT- INJECTION  SUBJECTIVE:  Angelica Allen is a 35 y.o. G108P1001 female here for a Depo Provera for contraception/period management. She is a GYN patient.   OBJECTIVE:  There were no vitals taken for this visit.  Appears well, in no apparent distress  Injection administered in: Left upper quad. gluteus  Meds ordered this encounter  Medications   medroxyPROGESTERone (DEPO-PROVERA) injection 150 mg    ASSESSMENT: GYN patient Depo Provera for contraception/period management PLAN: Follow-up: in 11-13 weeks for next Depo   Malachy Mood  08/16/2021 3:48 PM

## 2021-11-08 ENCOUNTER — Other Ambulatory Visit: Payer: Self-pay

## 2021-11-08 ENCOUNTER — Ambulatory Visit: Payer: Commercial Managed Care - PPO

## 2021-11-08 ENCOUNTER — Ambulatory Visit (INDEPENDENT_AMBULATORY_CARE_PROVIDER_SITE_OTHER): Payer: Commercial Managed Care - PPO | Admitting: *Deleted

## 2021-11-08 ENCOUNTER — Encounter: Payer: Self-pay | Admitting: *Deleted

## 2021-11-08 DIAGNOSIS — Z3042 Encounter for surveillance of injectable contraceptive: Secondary | ICD-10-CM

## 2021-11-08 MED ORDER — MEDROXYPROGESTERONE ACETATE 150 MG/ML IM SUSP
150.0000 mg | Freq: Once | INTRAMUSCULAR | Status: AC
  Administered 2021-11-08: 150 mg via INTRAMUSCULAR

## 2021-11-08 NOTE — Progress Notes (Signed)
   NURSE VISIT- INJECTION  SUBJECTIVE:  Angelica Allen is a 35 y.o. G67P1001 female here for a Depo Provera for contraception/period management. She is a GYN patient.   OBJECTIVE:  There were no vitals taken for this visit.  Appears well, in no apparent distress  Injection administered in: Left upper quad. gluteus  Meds ordered this encounter  Medications   medroxyPROGESTERone (DEPO-PROVERA) injection 150 mg    ASSESSMENT: GYN patient Depo Provera for contraception/period management PLAN: Follow-up: in 11-13 weeks for next Depo   Jobe Marker  11/08/2021 3:49 PM

## 2021-12-20 ENCOUNTER — Ambulatory Visit (INDEPENDENT_AMBULATORY_CARE_PROVIDER_SITE_OTHER): Payer: Commercial Managed Care - PPO | Admitting: Obstetrics & Gynecology

## 2021-12-20 ENCOUNTER — Other Ambulatory Visit: Payer: Self-pay

## 2021-12-20 ENCOUNTER — Encounter: Payer: Self-pay | Admitting: Obstetrics & Gynecology

## 2021-12-20 VITALS — BP 124/73 | HR 116 | Ht <= 58 in | Wt 107.2 lb

## 2021-12-20 DIAGNOSIS — Z01411 Encounter for gynecological examination (general) (routine) with abnormal findings: Secondary | ICD-10-CM | POA: Diagnosis not present

## 2021-12-20 DIAGNOSIS — R109 Unspecified abdominal pain: Secondary | ICD-10-CM

## 2021-12-20 MED ORDER — MEDROXYPROGESTERONE ACETATE 150 MG/ML IM SUSP: 150.0000 mg | INTRAMUSCULAR | 4 refills | Status: DC

## 2021-12-20 NOTE — Progress Notes (Signed)
WELL-WOMAN EXAMINATION Patient name: Angelica Allen MRN [id number]  Date of birth: 1986-11-22 Chief Complaint:   Gynecologic Exam (Tightness in right side of neck; abd pain)  History of Present Illness:   Angelica Allen is a 35 y.o. G63P1001  female being seen today for a routine well-woman   -Abdominal pain- starting end of Oct/Nov and has continued.  Describes the pain as "searing," at its worst 8/10.  Pain along R>L side and mid-epigastric region.  Not related to food.  Previously taking ibuprofen, which was managing the pain, but told to stop.  Pain is mostly triggered when in the same position for prolonged period.  Pain is sort of constant, but worsens in intensity.  No nausea or vomiting.  No irregular bleeding.   Seen in ER- diagnosed with recti diastasis and advised to f/u with OB/GYN.  No LMP recorded. Patient has had an injection. Denies issues with her menses- no period with Depot The current method of family planning is Depo-Provera injections.    Last pap 10/2019.  Last mammogram: n/a. Last colonoscopy: n/a  Depression screen [facility] 2/9 12/20/2021 10/29/2019 09/30/2017  Decreased Interest 0 0 0  Down, Depressed, Hopeless 1 0 0  PHQ - 2 Score 1 0 0  Altered sleeping 0 - -  Tired, decreased energy 2 - -  Change in appetite 0 - -  Feeling bad or failure about yourself  1 - -  Trouble concentrating 0 - -  Moving slowly or fidgety/restless 0 - -  Suicidal thoughts 0 - -  PHQ-9 Score 4 - -      Review of Systems:   Pertinent items are noted in HPI Denies any headaches, blurred vision, fatigue, shortness of breath, chest pain, bowel movements, urination, or intercourse unless otherwise stated above.  Pertinent History Reviewed:  Reviewed past medical,surgical, social and family history.  Reviewed problem list, medications and allergies. Physical Assessment:   Vitals:   12/20/21 1532  BP: 124/73  Pulse: (!) 116  Weight: 107 lb 3.2 oz (48.6 kg)  Height:  4\' 5"  (1.346 m)  Body mass index is 26.83 kg/m.        Physical Examination:   General appearance - well appearing, and in no distress  Mental status - alert, oriented to person, place, and time  Psych:  She has a normal mood and affect  Skin - warm and dry, normal color, no suspicious lesions noted  Chest - effort normal, all lung fields clear to auscultation bilaterally  Heart - normal rate and regular rhythm  Neck:  midline trachea, no thyromegaly or nodules  Breasts - breasts appear normal, no suspicious masses, no skin or nipple changes or  axillary nodes  Abdomen - pt points to her side from ribs down to hip- abdomen soft, nontender, nondistended, no masses or organomegaly- no reproducible pain  Back- no CVA tenderness  Pelvic - VULVA: normal appearing vulva with no masses, tenderness or lesions  VAGINA: normal appearing vagina with normal color and discharge, no lesions  CERVIX: normal appearing cervix without discharge or lesions, no CMT  Thin prep pap is not done - currently up todate  UTERUS: uterus is felt to be normal size, shape, consistency and nontender   Extremities:  No swelling or varicosities noted  Chaperone:     Assessment & Plan:  1) Well-Woman Exam -pap up to date, reviewed ASCCP guidelines  2) Contraception -continue Depot every 3 mos  3) Torso/side pain -etiology of  pain unclear -based on location and history do not think pain is gyn in nature -advised f/u with PCP or potentially even chiropractor -if needed would consider pelvic US to r/o gyn etiology should this continue to be an area of concern  Meds: No orders of the defined types were placed in this encounter.   Follow-up: Return in about 1 year (around 12/20/2022) for Annual and every 3 mos Depot.   Angelica Hidalgo, DO Attending Obstetrician & Gynecologist, [facility] [employer], [facility]

## 2022-01-27 ENCOUNTER — Other Ambulatory Visit: Payer: Self-pay | Admitting: Adult Health

## 2022-02-01 ENCOUNTER — Encounter: Payer: Self-pay | Admitting: Adult Health

## 2022-02-01 ENCOUNTER — Ambulatory Visit: Payer: Commercial Managed Care - PPO

## 2022-02-01 ENCOUNTER — Ambulatory Visit (INDEPENDENT_AMBULATORY_CARE_PROVIDER_SITE_OTHER): Payer: Commercial Managed Care - PPO | Admitting: Adult Health

## 2022-02-01 ENCOUNTER — Other Ambulatory Visit: Payer: Self-pay

## 2022-02-01 VITALS — BP 128/74 | HR 119 | Ht <= 58 in | Wt 109.0 lb

## 2022-02-01 DIAGNOSIS — N83202 Unspecified ovarian cyst, left side: Secondary | ICD-10-CM

## 2022-02-01 DIAGNOSIS — R1032 Left lower quadrant pain: Secondary | ICD-10-CM | POA: Diagnosis not present

## 2022-02-01 DIAGNOSIS — Z3042 Encounter for surveillance of injectable contraceptive: Secondary | ICD-10-CM | POA: Diagnosis not present

## 2022-02-01 MED ORDER — MEDROXYPROGESTERONE ACETATE 150 MG/ML IM SUSP
150.0000 mg | Freq: Once | INTRAMUSCULAR | Status: AC
  Administered 2022-02-01: 150 mg via INTRAMUSCULAR

## 2022-02-01 NOTE — Progress Notes (Signed)
°  Subjective:     Patient ID: Angelica Allen, female   DOB: March 16, 1986, 36 y.o.   MRN: [id number]  HPI Angelica Allen is a 36 year old white female, married, G1P1 in having had CT 01/22/22 and found to have 2.6 cm cyst on left ovary and gallstones, and umbilical hernia. She is having surgery in about 2 weeks at [facility] for GB removal and hernia repair. She got depo today.  Lab Results  Component Value Date   DIAGPAP  10/29/2019    - Negative for intraepithelial lesion or malignancy (NILM)   HPVHIGH Negative 10/29/2019   PCP in [place] , with [place].   Review of Systems LLQ pain at times, goes and comes No pain with sex Had pain under ribs and below navel  Reviewed past medical,surgical, social and family history. Reviewed medications and allergies.     Objective:   Physical Exam BP 128/74 (BP Location: Right Arm, Patient Position: Sitting, Cuff Size: Normal)    Pulse (!) 119    Ht 4\' 6"  (1.372 m)    Wt 109 lb (49.4 kg)    BMI 26.28 kg/m     Skin warm and dry. Lungs: clear to ausculation bilaterally. Cardiovascular: regular rate and rhythm.     Assessment:     1. Surveillance for Depo-Provera contraception Received depo today  2. Cyst of left ovary Will get 02/07/22 at 3:30 pm at [facility] Will talk when results back  3. LLQ pain     Plan:     Depo in 12 weeks

## 2022-02-07 ENCOUNTER — Other Ambulatory Visit: Payer: Self-pay

## 2022-02-07 ENCOUNTER — Other Ambulatory Visit (HOSPITAL_COMMUNITY): Payer: Commercial Managed Care - PPO

## 2022-02-07 ENCOUNTER — Ambulatory Visit (HOSPITAL_COMMUNITY)
Admission: RE | Admit: 2022-02-07 | Discharge: 2022-02-07 | Disposition: A | Discharge status: Home / Self Care | Payer: Commercial Managed Care - PPO | Source: Ambulatory Visit | Attending: Adult Health | Admitting: Adult Health

## 2022-02-07 DIAGNOSIS — N83202 Unspecified ovarian cyst, left side: Secondary | ICD-10-CM | POA: Insufficient documentation

## 2022-02-12 ENCOUNTER — Telehealth: Payer: Self-pay | Admitting: Adult Health

## 2022-02-12 NOTE — Telephone Encounter (Signed)
Left message @ 4:54 pm. JSY 

## 2022-02-12 NOTE — Telephone Encounter (Signed)
Patient called stating that she had some imaging done at the hospital requested by Anderson Malta and she has not heard the results. Pt states she had it done last week Wed. Please contact pt

## 2022-02-13 NOTE — Telephone Encounter (Signed)
Pt saw Jenn's message on her MyChart regarding her [place] results. Pt wonders if she needs to go ahead and have a hyst to prevent any further issues. Please advise. Thanks!! JSY

## 2022-02-14 NOTE — Telephone Encounter (Signed)
Pt aware a hyst is not warranted by [place] at this time but if she wants to make an appt with MD, she can. Pt voiced understanding and will hold off on appt at this time. [place]

## 2022-04-24 ENCOUNTER — Ambulatory Visit (INDEPENDENT_AMBULATORY_CARE_PROVIDER_SITE_OTHER): Payer: Commercial Managed Care - PPO

## 2022-04-24 DIAGNOSIS — Z3042 Encounter for surveillance of injectable contraceptive: Secondary | ICD-10-CM

## 2022-04-24 MED ORDER — MEDROXYPROGESTERONE ACETATE 150 MG/ML IM SUSP
150.0000 mg | Freq: Once | INTRAMUSCULAR | Status: AC
  Administered 2022-04-24: 150 mg via INTRAMUSCULAR

## 2022-04-24 NOTE — Progress Notes (Signed)
? ?  NURSE VISIT- INJECTION ? ?SUBJECTIVE:  ?Angelica Allen is a 36 y.o. G27P1001 female here for a Depo Provera for contraception/period management. She is a GYN patient.  ? ?OBJECTIVE:  ?There were no vitals taken for this visit.  ?Appears well, in no apparent distress ? ?Injection administered in: Right upper quad. gluteus ? ?Meds ordered this encounter  ?Medications  ? medroxyPROGESTERone (DEPO-PROVERA) injection 150 mg  ? ? ?ASSESSMENT: ?GYN patient Depo Provera for contraception/period management ?PLAN: ?Follow-up: in 11-13 weeks for next Depo  ? ?Amour Trigg A Reona Zendejas  ?04/24/2022 ?9:38 AM ? ?

## 2022-07-17 ENCOUNTER — Ambulatory Visit (INDEPENDENT_AMBULATORY_CARE_PROVIDER_SITE_OTHER): Payer: Self-pay | Admitting: *Deleted

## 2022-07-17 DIAGNOSIS — Z3042 Encounter for surveillance of injectable contraceptive: Secondary | ICD-10-CM

## 2022-07-17 MED ORDER — MEDROXYPROGESTERONE ACETATE 150 MG/ML IM SUSP
150.0000 mg | Freq: Once | INTRAMUSCULAR | Status: AC
  Administered 2022-07-17: 150 mg via INTRAMUSCULAR

## 2022-07-17 NOTE — Progress Notes (Signed)
   NURSE VISIT- INJECTION  SUBJECTIVE:  Angelica Allen is a 36 y.o. G75P1001 female here for a Depo Provera for contraception/period management. She is a GYN patient.   OBJECTIVE:  There were no vitals taken for this visit.  Appears well, in no apparent distress  Injection administered in: Left upper quad. gluteus  Meds ordered this encounter  Medications   medroxyPROGESTERone (DEPO-PROVERA) injection 150 mg    ASSESSMENT: GYN patient Depo Provera for contraception/period management PLAN: Follow-up: in 11-13 weeks for next Depo. Pt is moving to [place].    Malachy Mood  07/17/2022 4:36 PM

## 2023-11-10 IMAGING — US US PELVIS COMPLETE WITH TRANSVAGINAL
1 series · 12 of 25 positions shown · non-contrast
Comparison: None.
COMPARISON: None.

Addendum:
CLINICAL DATA: Left ovarian cyst seen in the previous CT done in
outside institution

EXAM:
TRANSABDOMINAL AND TRANSVAGINAL ULTRASOUND OF PELVIS
DOPPLER ULTRASOUND OF OVARIES
TECHNIQUE: Both transabdominal and transvaginal ultrasound examinations of the
pelvis were performed. Transabdominal technique was performed for
global imaging of the pelvis including uterus, ovaries, adnexal
regions, and pelvic cul-de-sac.
It was necessary to proceed with endovaginal exam following the
transabdominal exam to visualize the ovaries. Color and duplex
Doppler ultrasound was utilized to evaluate blood flow to the
ovaries.

[Series 1: us pelvic complete with transvaginal · 12 of 120 slices shown]
[im 5/120]
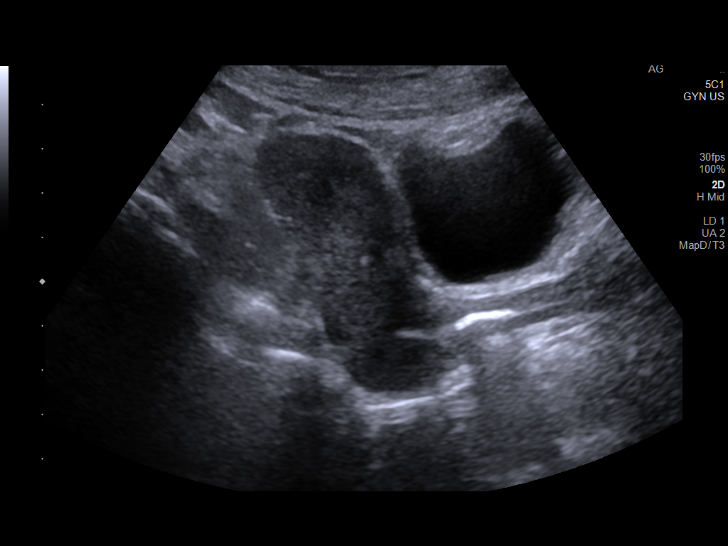
[im 15/120]
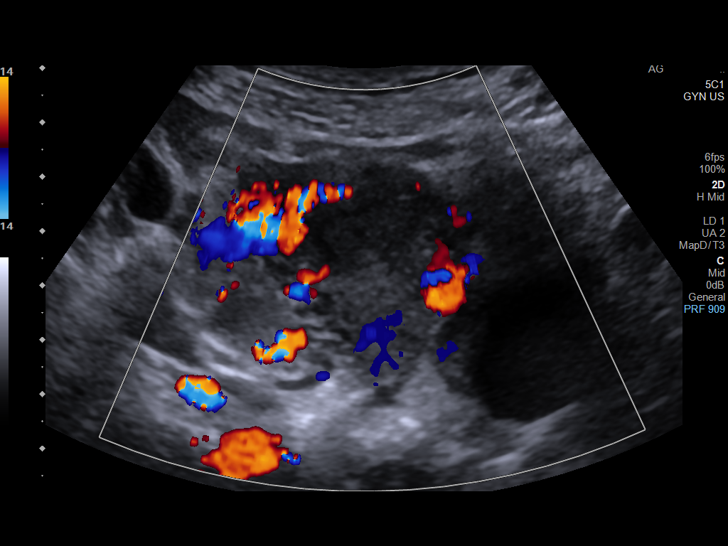
[im 25/120]
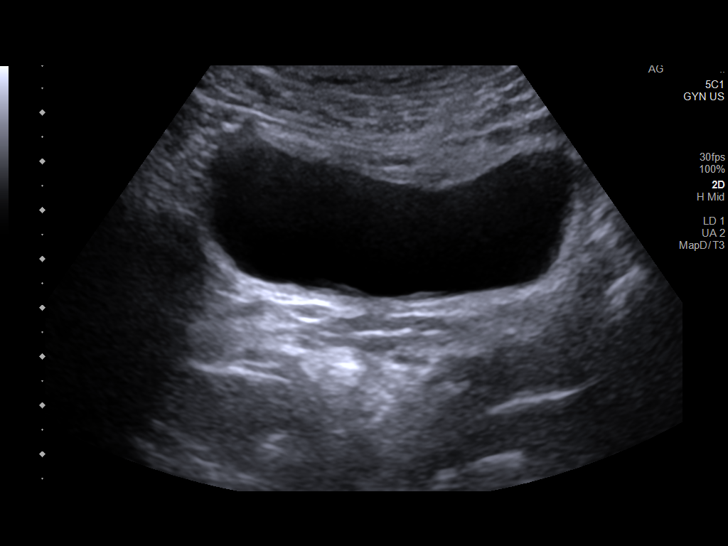
[im 35/120]
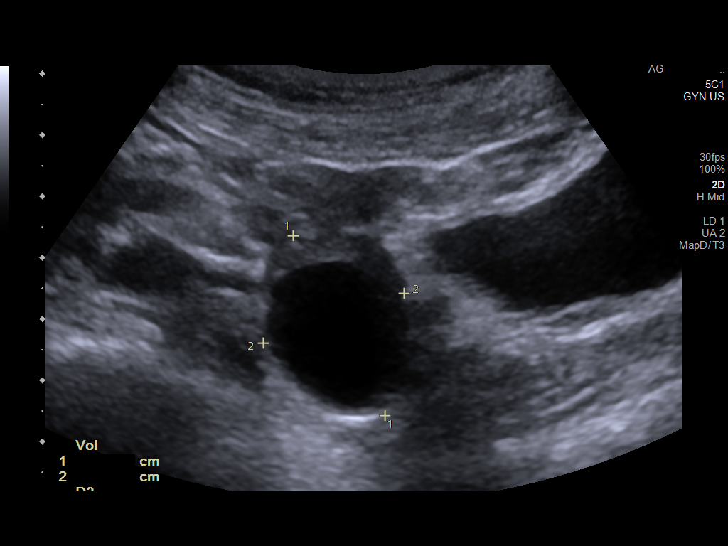
[im 45/120]
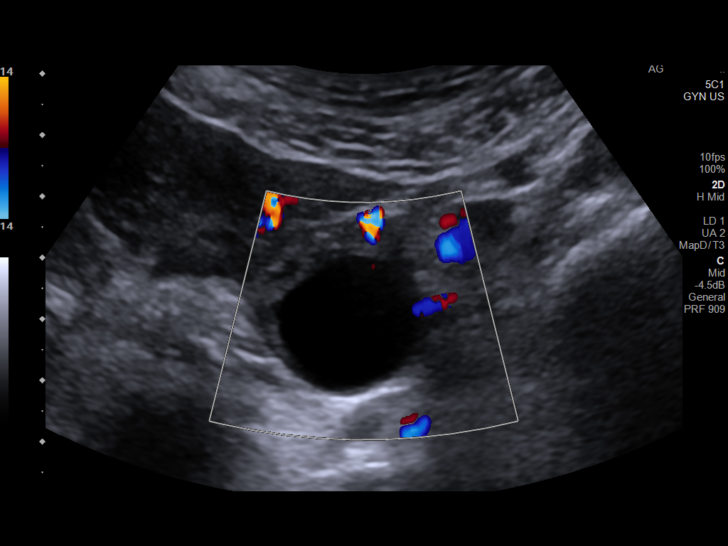
[im 55/120]
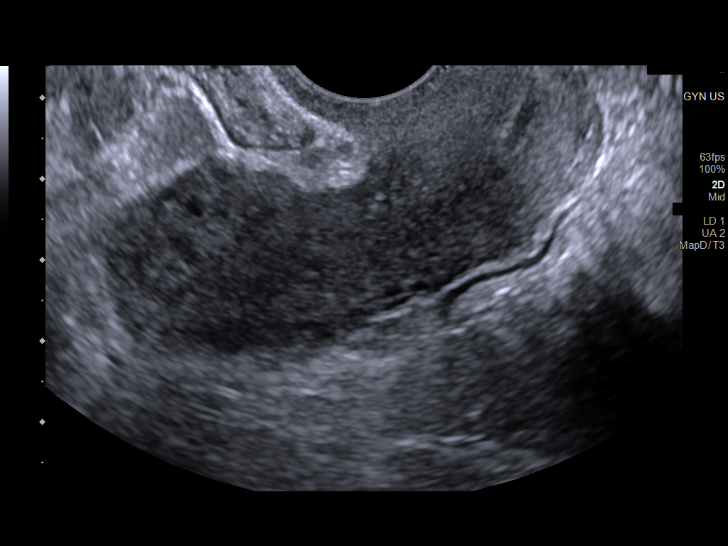
[im 65/120]
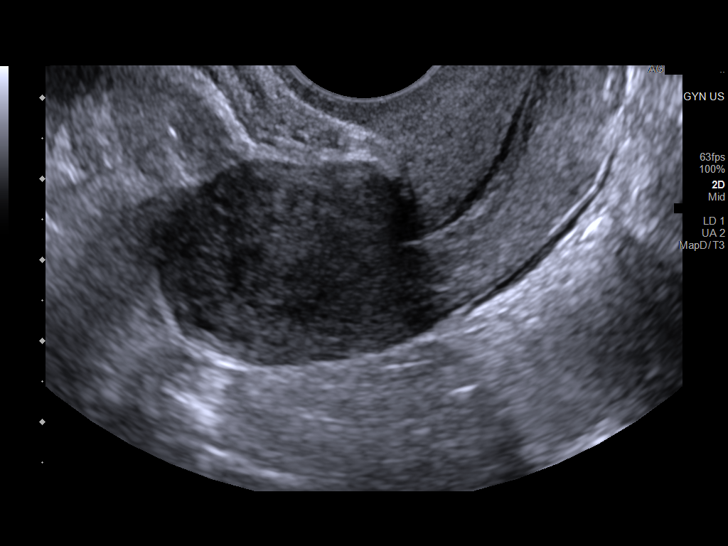
[im 75/120]
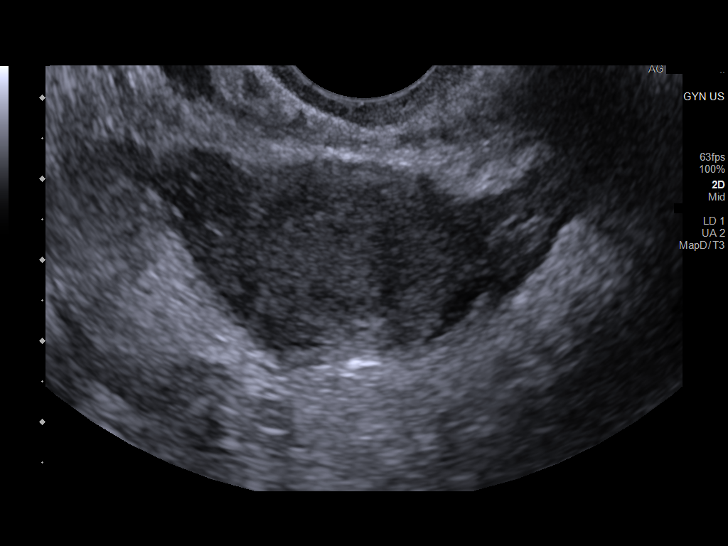
[im 85/120]
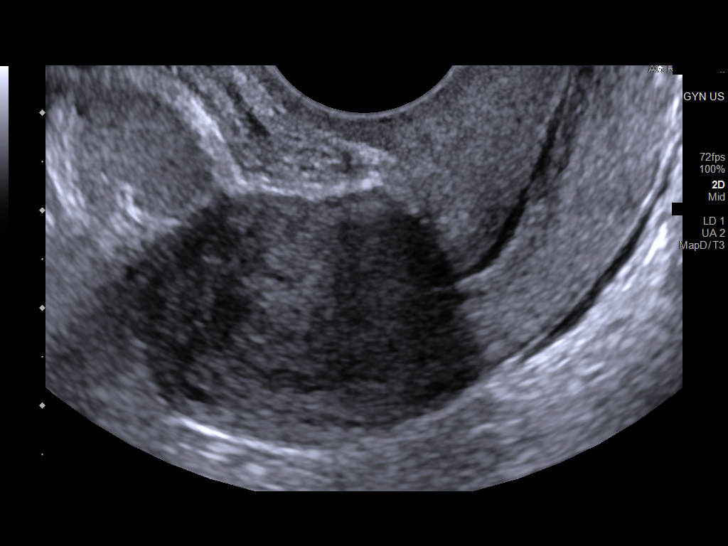
[im 95/120]
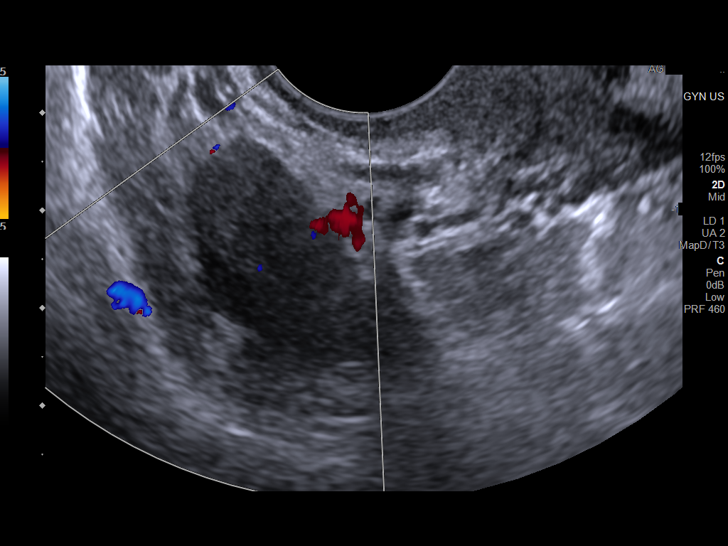
[im 105/120]
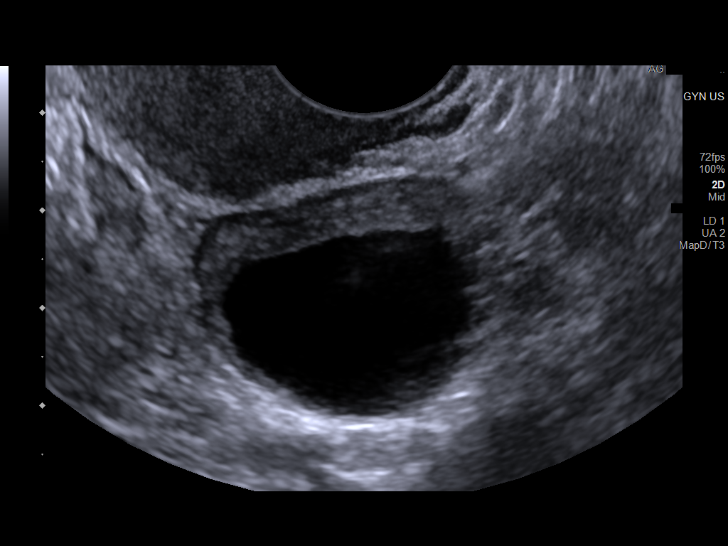
[im 115/120]
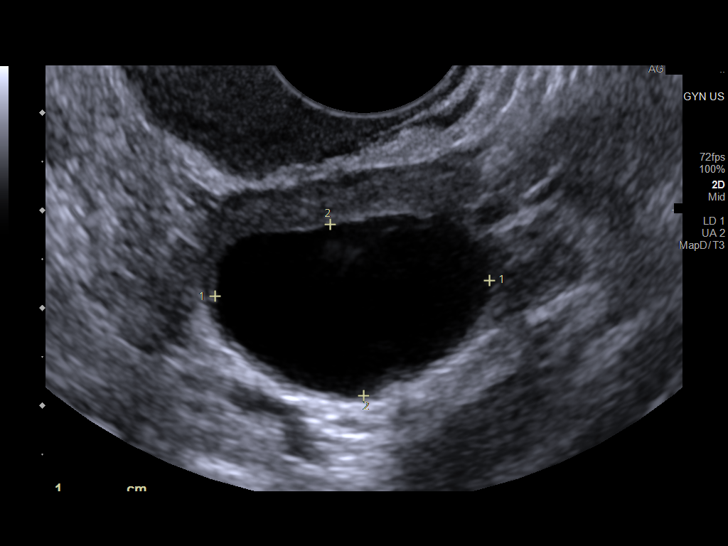

[12 of 25 positions shown; findings below may reference images not displayed]

FINDINGS: Uterus

Measurements: 6.4 x 2.8 x 4.2 cm = volume: 40.1 mL. There is
inhomogeneous echogenicity in myometrium without discrete nodules.
There is small amount of fluid in the endometrial cavity in the
lower uterine segment and cervical canal. Cervix is closed.

Endometrium

Thickness: 3.6 mm.  No focal abnormality visualized.

Right ovary

Measurements: 2.7 x 1.4 x 1.6 cm = volume: 3.1 mL. Normal
appearance/no adnexal mass.

Left ovary

Measurements: 3.1 x 2.4 x 2.8 cm = volume: 10.7 mL. There is 2.8 x
1.8 x 2.4 cm cyst, possibly dominant follicle or functional cyst.
There are no internal septations or mural nodules.

Pulsed Doppler evaluation of both ovaries demonstrates normal
low-resistance arterial and venous waveforms.

Other findings

No abnormal free fluid.
IMPRESSION: There is small amount of fluid in the endometrial cavity in the
lower uterine segment and in the cervical canal. This may suggest
small amount of blood in the endometrial cavity. There is slightly
inhomogeneous echogenicity in the myometrium without discrete
nodules.

2.8 cm simple appearing cyst is seen in the left ovary suggesting a
dominant follicle or functional cyst.

ADDENDUM:
This addendum is made to clarify the Doppler technique used for the
study. Color flow Doppler was performed. Pulsed Doppler examination
was not performed.

*** End of Addendum ***
FINDINGS: Uterus

Measurements: 6.4 x 2.8 x 4.2 cm = volume: 40.1 mL. There is
inhomogeneous echogenicity in myometrium without discrete nodules.
There is small amount of fluid in the endometrial cavity in the
lower uterine segment and cervical canal. Cervix is closed.

Endometrium

Thickness: 3.6 mm.  No focal abnormality visualized.

Right ovary

Measurements: 2.7 x 1.4 x 1.6 cm = volume: 3.1 mL. Normal
appearance/no adnexal mass.

Left ovary

Measurements: 3.1 x 2.4 x 2.8 cm = volume: 10.7 mL. There is 2.8 x
1.8 x 2.4 cm cyst, possibly dominant follicle or functional cyst.
There are no internal septations or mural nodules.

Pulsed Doppler evaluation of both ovaries demonstrates normal
low-resistance arterial and venous waveforms.

Other findings

No abnormal free fluid.
IMPRESSION: There is small amount of fluid in the endometrial cavity in the
lower uterine segment and in the cervical canal. This may suggest
small amount of blood in the endometrial cavity. There is slightly
inhomogeneous echogenicity in the myometrium without discrete
nodules.

2.8 cm simple appearing cyst is seen in the left ovary suggesting a
dominant follicle or functional cyst.
